# Patient Record
Sex: Male | Born: 1985 | Race: White | Hispanic: No | Marital: Single | Smoking: Current every day smoker
Health system: Southern US, Community
[De-identification: ages and names within clinical notes are randomized; demographics above are authoritative.]

## PROBLEM LIST (undated history)

## (undated) HISTORY — PX: SHOULDER SURGERY: SHX246

---

## 2011-06-23 ENCOUNTER — Encounter (HOSPITAL_COMMUNITY): Payer: Self-pay | Admitting: *Deleted

## 2011-06-23 ENCOUNTER — Emergency Department (HOSPITAL_COMMUNITY)
Admission: EM | Admit: 2011-06-23 | Discharge: 2011-06-23 | Disposition: A | Discharge status: Home / Self Care | Payer: Self-pay | Attending: Emergency Medicine | Admitting: Emergency Medicine

## 2011-06-23 DIAGNOSIS — K047 Periapical abscess without sinus: Secondary | ICD-10-CM | POA: Insufficient documentation

## 2011-06-23 DIAGNOSIS — R220 Localized swelling, mass and lump, head: Secondary | ICD-10-CM | POA: Insufficient documentation

## 2011-06-23 DIAGNOSIS — R221 Localized swelling, mass and lump, neck: Secondary | ICD-10-CM | POA: Insufficient documentation

## 2011-06-23 DIAGNOSIS — K029 Dental caries, unspecified: Secondary | ICD-10-CM | POA: Insufficient documentation

## 2011-06-23 DIAGNOSIS — H9209 Otalgia, unspecified ear: Secondary | ICD-10-CM | POA: Insufficient documentation

## 2011-06-23 DIAGNOSIS — K089 Disorder of teeth and supporting structures, unspecified: Secondary | ICD-10-CM | POA: Insufficient documentation

## 2011-06-23 MED ORDER — AMOXICILLIN 500 MG PO CAPS: 500.0000 mg | ORAL_CAPSULE | Freq: Three times a day (TID) | ORAL | Status: AC

## 2011-06-23 MED ORDER — IBUPROFEN 600 MG PO TABS: 600.0000 mg | ORAL_TABLET | Freq: Four times a day (QID) | ORAL | Status: AC | PRN

## 2011-06-23 MED ORDER — HYDROCODONE-ACETAMINOPHEN 5-325 MG PO TABS: 1.0000 | ORAL_TABLET | ORAL | Status: AC | PRN

## 2011-06-23 NOTE — ED Provider Notes (Signed)
Medical screening examination/treatment/procedure(s) were performed by non-physician practitioner and as supervising physician I was immediately available for consultation/collaboration.   Nathian Stencil, MD 06/23/11 2028 

## 2011-06-23 NOTE — ED Provider Notes (Signed)
History     CSN: [id number]  Arrival date & time 06/23/11  1816   First MD Initiated Contact with Patient 06/23/11 1838      Chief Complaint  Patient presents with  . Dental Pain    (Consider location/radiation/quality/duration/timing/severity/associated sxs/prior treatment) Patient is a 26 y.o. male presenting with tooth pain. The history is provided by the patient.  Dental PainThe primary symptoms include mouth pain. Primary symptoms do not include headaches, fever, shortness of breath or sore throat. Primary symptoms comment: Patient has a history of poor dentition, with multiple caries. This afternoon he developed rather sudden onset of pain with increasing swelling in his left upper first molar tooth. The symptoms began 2 to 6 hours ago. The symptoms are worsening. The symptoms occur constantly.  Additional symptoms include: gum swelling, gum tenderness and ear pain. Additional symptoms do not include: purulent gums and facial swelling. Medical issues include: smoking.    History reviewed. No pertinent past medical history.  Past Surgical History  Procedure Date  . Shoulder surgery     Family History  Problem Relation Age of Onset  . Diabetes Mother   . Heart failure Father     History  Substance Use Topics  . Smoking status: Current Everyday Smoker  . Smokeless tobacco: Not on file  . Alcohol Use: No      Review of Systems  Constitutional: Negative for fever.  HENT: Positive for ear pain and dental problem. Negative for congestion, sore throat, facial swelling and neck pain.   Eyes: Negative.   Respiratory: Negative for chest tightness and shortness of breath.   Cardiovascular: Negative for chest pain.  Gastrointestinal: Negative for nausea and abdominal pain.  Genitourinary: Negative.   Musculoskeletal: Negative for joint swelling and arthralgias.  Skin: Negative.  Negative for rash and wound.  Neurological: Negative for dizziness, weakness,  light-headedness, numbness and headaches.  Hematological: Negative.   Psychiatric/Behavioral: Negative.     Allergies  Sulfa antibiotics  Home Medications   Current Outpatient Rx  Name Route Sig Dispense Refill  . AMOXICILLIN 500 MG PO CAPS Oral Take 1 capsule (500 mg total) by mouth 3 (three) times daily. 30 capsule 0  . HYDROCODONE-ACETAMINOPHEN 5-325 MG PO TABS Oral Take 1 tablet by mouth every 4 (four) hours as needed for pain. 15 tablet 0  . IBUPROFEN 600 MG PO TABS Oral Take 1 tablet (600 mg total) by mouth every 6 (six) hours as needed for pain. 30 tablet 0    BP 123/74  Pulse 88  Temp(Src) 98.1 F (36.7 C) (Oral)  Resp 20  Wt 163 lb (73.936 kg)  SpO2 98%  Physical Exam  Constitutional: He is oriented to person, place, and time. He appears well-developed and well-nourished. No distress.  HENT:  Head: Normocephalic and atraumatic.  Right Ear: Tympanic membrane and external ear normal.  Left Ear: Tympanic membrane and external ear normal.  Mouth/Throat: Oropharynx is clear and moist and mucous membranes are normal. No oral lesions. Dental abscesses present.    Eyes: Conjunctivae are normal.  Neck: Normal range of motion. Neck supple.  Cardiovascular: Normal rate and normal heart sounds.   Pulmonary/Chest: Effort normal.  Abdominal: He exhibits no distension.  Musculoskeletal: Normal range of motion.  Lymphadenopathy:    He has no cervical adenopathy.  Neurological: He is alert and oriented to person, place, and time.  Skin: Skin is warm and dry. No erythema.  Psychiatric: He has a normal mood and affect.    ED  Course  Procedures (including critical care time)  Labs Reviewed - No data to display No results found.   1. Dental abscess       MDM  Amoxil, ibuprofen 600 mg, hydrocodone #15. Dental referral list given.        Candis Musa, PA 06/23/11 [phone]

## 2011-06-23 NOTE — ED Notes (Signed)
Pt states tooth started "hurting earlier in the day." Pt reports upper left molar is sore, dental cary noted.

## 2012-02-14 ENCOUNTER — Emergency Department (HOSPITAL_COMMUNITY)
Admission: EM | Admit: 2012-02-14 | Discharge: 2012-02-14 | Disposition: A | Discharge status: Home / Self Care | Payer: Self-pay | Attending: Emergency Medicine | Admitting: Emergency Medicine

## 2012-02-14 ENCOUNTER — Encounter (HOSPITAL_COMMUNITY): Payer: Self-pay | Admitting: *Deleted

## 2012-02-14 DIAGNOSIS — R070 Pain in throat: Secondary | ICD-10-CM | POA: Insufficient documentation

## 2012-02-14 DIAGNOSIS — J020 Streptococcal pharyngitis: Secondary | ICD-10-CM

## 2012-02-14 DIAGNOSIS — R509 Fever, unspecified: Secondary | ICD-10-CM | POA: Insufficient documentation

## 2012-02-14 MED ORDER — PENICILLIN G BENZATHINE 1200000 UNIT/2ML IM SUSP
1.2000 10*6.[IU] | Freq: Once | INTRAMUSCULAR | Status: AC
  Administered 2012-02-14: 1.2 10*6.[IU] via INTRAMUSCULAR
  Filled 2012-02-14: qty 2

## 2012-02-14 MED ORDER — OXYCODONE-ACETAMINOPHEN 5-325 MG PO TABS: 2.0000 | ORAL_TABLET | ORAL | Status: AC | PRN

## 2012-02-14 MED ORDER — OXYCODONE-ACETAMINOPHEN 5-325 MG PO TABS
1.0000 | ORAL_TABLET | Freq: Once | ORAL | Status: AC
  Administered 2012-02-14: 1 via ORAL
  Filled 2012-02-14: qty 1

## 2012-02-14 NOTE — ED Notes (Signed)
Sore throat, fever,chills, body aches.headache

## 2012-02-14 NOTE — ED Provider Notes (Signed)
History     CSN: [id number]  Arrival date & time 02/14/12  2030   First MD Initiated Contact with Patient 02/14/12 2111      Chief Complaint  Patient presents with  . Sore Throat    (Consider location/radiation/quality/duration/timing/severity/associated sxs/prior treatment) HPI Pt reports 24hrs of sore throat, moderate to severe and aching, worse with swallowing. Associated with fever, body aches, but no cough or vomiting.  History reviewed. No pertinent past medical history.  Past Surgical History  Procedure Date  . Shoulder surgery     Family History  Problem Relation Age of Onset  . Diabetes Mother   . Heart failure Father     History  Substance Use Topics  . Smoking status: Current Everyday Smoker  . Smokeless tobacco: Not on file  . Alcohol Use: No      Review of Systems All other systems reviewed and are negative except as noted in HPI.   Allergies  Sulfa antibiotics  Home Medications  No current outpatient prescriptions on file.  BP 121/65  Pulse 120  Temp 100.8 F (38.2 C) (Oral)  Resp 20  Ht 6\' 1"  (1.854 m)  Wt 165 lb (74.844 kg)  BMI 21.77 kg/m2  SpO2 100%  Physical Exam  Nursing note and vitals reviewed. Constitutional: He is oriented to person, place, and time. He appears well-developed and well-nourished.  HENT:  Head: Normocephalic and atraumatic.  Mouth/Throat: No oropharyngeal exudate.       Erythematous and indurated posterior pharynx, no tonsillar exudate  Eyes: EOM are normal. Pupils are equal, round, and reactive to light.  Neck: Normal range of motion. Neck supple.  Cardiovascular: Normal rate, normal heart sounds and intact distal pulses.   Pulmonary/Chest: Effort normal and breath sounds normal.  Abdominal: Bowel sounds are normal. He exhibits no distension. There is no tenderness.  Musculoskeletal: Normal range of motion. He exhibits no edema and no tenderness.  Lymphadenopathy:    He has cervical adenopathy.    Neurological: He is alert and oriented to person, place, and time. He has normal strength. No cranial nerve deficit or sensory deficit.  Skin: Skin is warm and dry. No rash noted.  Psychiatric: He has a normal mood and affect.    ED Course  Procedures (including critical care time)  Labs Reviewed - No data to display No results found.   No diagnosis found.    MDM  Pt with 3/4 Centor criteria, will treat empirically for Strep.       Charles B. Bernette Mayers, MD 02/14/12 2118

## 2012-02-15 ENCOUNTER — Encounter (HOSPITAL_COMMUNITY): Payer: Self-pay | Admitting: *Deleted

## 2012-02-15 ENCOUNTER — Emergency Department (HOSPITAL_COMMUNITY)
Admission: EM | Admit: 2012-02-15 | Discharge: 2012-02-15 | Disposition: A | Discharge status: Home / Self Care | Payer: Self-pay | Attending: Emergency Medicine | Admitting: Emergency Medicine

## 2012-02-15 DIAGNOSIS — J029 Acute pharyngitis, unspecified: Secondary | ICD-10-CM | POA: Insufficient documentation

## 2012-02-15 DIAGNOSIS — F17200 Nicotine dependence, unspecified, uncomplicated: Secondary | ICD-10-CM | POA: Insufficient documentation

## 2012-02-15 DIAGNOSIS — Z882 Allergy status to sulfonamides status: Secondary | ICD-10-CM | POA: Insufficient documentation

## 2012-02-15 NOTE — ED Notes (Signed)
Pt reports continued sore throat, and generalized aches.  Was seen yesterday for same, states not improving.

## 2012-02-22 NOTE — ED Provider Notes (Signed)
History    Male with a sore throat. Onset about 3 days ago. Patient was just evaluated in emergency room for the same complaint 2 days ago. She is treated periodically for strep with Bicillin injection. This returned because he continued symptoms. She says her pain is no better. No difficulty breathing or swallowing. Mild nausea but no vomiting. No coughStridor or wheezing. Does not feel short of breath. CSN: [id number]  Arrival date & time 02/15/12  1908   First MD Initiated Contact with Patient 02/15/12 1951      Chief Complaint  Patient presents with  . Sore Throat    (Consider location/radiation/quality/duration/timing/severity/associated sxs/prior treatment) HPI  History reviewed. No pertinent past medical history.  Past Surgical History  Procedure Date  . Shoulder surgery     Family History  Problem Relation Age of Onset  . Diabetes Mother   . Heart failure Father     History  Substance Use Topics  . Smoking status: Current Every Day Smoker  . Smokeless tobacco: Not on file  . Alcohol Use: No      Review of Systems   Review of symptoms negative unless otherwise noted in HPI.   Allergies  Sulfa antibiotics  Home Medications   Current Outpatient Rx  Name Route Sig Dispense Refill  . CHLORPHEN-PHENYLEPH-ASA 2-7.8-325 MG PO TBEF Oral Take 2 capsules by mouth daily as needed.    . OXYCODONE-ACETAMINOPHEN 5-325 MG PO TABS Oral Take 2 tablets by mouth every 4 (four) hours as needed for pain. 15 tablet 0    BP 118/80  Pulse 91  Temp 97.7 F (36.5 C) (Oral)  Resp 20  Ht 6\' 1"  (1.854 m)  Wt 165 lb (74.844 kg)  BMI 21.77 kg/m2  SpO2 99%  Physical Exam  Nursing note and vitals reviewed. Constitutional: He appears well-developed and well-nourished. No distress.  HENT:  Head: Normocephalic and atraumatic.       Pharyngitis with exudate. Uvula is midline. Handling secretions. Normal phonation. Submental tissues are soft. Neck supple. Tender cervical  adenopathy.  Eyes: Conjunctivae normal are normal. Right eye exhibits no discharge. Left eye exhibits no discharge.  Neck: Neck supple.  Cardiovascular: Normal rate, regular rhythm and normal heart sounds.  Exam reveals no gallop and no friction rub.   No murmur heard. Pulmonary/Chest: Effort normal and breath sounds normal. No respiratory distress.  Abdominal: Soft. He exhibits no distension. There is no tenderness.  Musculoskeletal: He exhibits no edema and no tenderness.  Neurological: He is alert.  Skin: Skin is warm and dry.  Psychiatric: He has a normal mood and affect. His behavior is normal. Thought content normal.    ED Course  Procedures (including critical care time)  Labs Reviewed - No data to display No results found.   1. Pharyngitis       MDM  Male with sore throat. Patient was just evaluated for the same. Empirically with strep with Bicillin injection. She is returning for continued symptoms. On exam there is no evidence of deep space neck infection. Plan continued symptomatic treatment. Outpatient followup as needed otherwise.        Raeford Razor, MD 02/22/12 [phone]

## 2012-08-07 ENCOUNTER — Encounter (HOSPITAL_COMMUNITY): Payer: Self-pay | Admitting: *Deleted

## 2012-08-07 ENCOUNTER — Emergency Department (HOSPITAL_COMMUNITY): Payer: Self-pay

## 2012-08-07 ENCOUNTER — Emergency Department (HOSPITAL_COMMUNITY)
Admission: EM | Admit: 2012-08-07 | Discharge: 2012-08-07 | Disposition: A | Discharge status: Home / Self Care | Payer: Self-pay | Attending: Emergency Medicine | Admitting: Emergency Medicine

## 2012-08-07 DIAGNOSIS — W208XXA Other cause of strike by thrown, projected or falling object, initial encounter: Secondary | ICD-10-CM | POA: Insufficient documentation

## 2012-08-07 DIAGNOSIS — Y9389 Activity, other specified: Secondary | ICD-10-CM | POA: Insufficient documentation

## 2012-08-07 DIAGNOSIS — F17200 Nicotine dependence, unspecified, uncomplicated: Secondary | ICD-10-CM | POA: Insufficient documentation

## 2012-08-07 DIAGNOSIS — S9032XA Contusion of left foot, initial encounter: Secondary | ICD-10-CM

## 2012-08-07 DIAGNOSIS — S90129A Contusion of unspecified lesser toe(s) without damage to nail, initial encounter: Secondary | ICD-10-CM | POA: Insufficient documentation

## 2012-08-07 DIAGNOSIS — Y929 Unspecified place or not applicable: Secondary | ICD-10-CM | POA: Insufficient documentation

## 2012-08-07 MED ORDER — IBUPROFEN 800 MG PO TABS: 800.0000 mg | ORAL_TABLET | Freq: Three times a day (TID) | ORAL | Status: DC

## 2012-08-07 MED ORDER — HYDROCODONE-ACETAMINOPHEN 5-325 MG PO TABS: 1.0000 | ORAL_TABLET | ORAL | Status: DC | PRN

## 2012-08-07 NOTE — ED Provider Notes (Signed)
History     CSN: [id number]  Arrival date & time 08/07/12  1443   First MD Initiated Contact with Patient 08/07/12 1613      Chief Complaint  Patient presents with  . Toe Pain    (Consider location/radiation/quality/duration/timing/severity/associated sxs/prior treatment) Patient is a 27 y.o. male presenting with toe pain. The history is provided by the patient.  Toe Pain This is a new problem. The current episode started yesterday. The problem occurs constantly. The problem has been unchanged. Pertinent negatives include no abdominal pain, arthralgias, chest pain, coughing, neck pain or numbness. The symptoms are aggravated by standing and walking. He has tried nothing for the symptoms. The treatment provided no relief.    History reviewed. No pertinent past medical history.  Past Surgical History  Procedure Laterality Date  . Shoulder surgery      Family History  Problem Relation Age of Onset  . Diabetes Mother   . Heart failure Father     History  Substance Use Topics  . Smoking status: Current Every Day Smoker  . Smokeless tobacco: Not on file  . Alcohol Use: No      Review of Systems  Constitutional: Negative for activity change.       All ROS Neg except as noted in HPI  HENT: Negative for nosebleeds and neck pain.   Eyes: Negative for photophobia and discharge.  Respiratory: Negative for cough, shortness of breath and wheezing.   Cardiovascular: Negative for chest pain and palpitations.  Gastrointestinal: Negative for abdominal pain and blood in stool.  Genitourinary: Negative for dysuria, frequency and hematuria.  Musculoskeletal: Negative for back pain and arthralgias.  Skin: Negative.   Neurological: Negative for dizziness, seizures, speech difficulty and numbness.  Psychiatric/Behavioral: Negative for hallucinations and confusion.    Allergies  Sulfa antibiotics  Home Medications   Current Outpatient Rx  Name  Route  Sig  Dispense  Refill  .  ibuprofen (ADVIL,MOTRIN) 200 MG tablet   Oral   Take 800 mg by mouth once as needed for pain.           BP 134/79  Pulse 72  Temp(Src) 97.9 F (36.6 C) (Oral)  Resp 18  SpO2 97%  Physical Exam  Nursing note and vitals reviewed. Constitutional: He is oriented to person, place, and time. He appears well-developed and well-nourished.  Non-toxic appearance.  HENT:  Head: Normocephalic.  Right Ear: Tympanic membrane and external ear normal.  Left Ear: Tympanic membrane and external ear normal.  Eyes: EOM and lids are normal. Pupils are equal, round, and reactive to light.  Neck: Normal range of motion. Neck supple. Carotid bruit is not present.  Cardiovascular: Normal rate, regular rhythm, normal heart sounds, intact distal pulses and normal pulses.   Pulmonary/Chest: Breath sounds normal. No respiratory distress.  Abdominal: Soft. Bowel sounds are normal. There is no tenderness. There is no guarding.  Musculoskeletal: Normal range of motion.  There is swelling and bruise to the second and third toe of the left foot. There is a bruise to the lateral mid left foot. There is no broken skin areas. There no lesions between the toes. There is no lesion of the plantar surface of the left foot. There is good range of motion of the toes. The Achilles tendon is intact. The dorsalis pedis pulses 2+.  Lymphadenopathy:       Head (right side): No submandibular adenopathy present.       Head (left side): No submandibular adenopathy present.  He has no cervical adenopathy.  Neurological: He is alert and oriented to person, place, and time. He has normal strength. No cranial nerve deficit or sensory deficit.  Skin: Skin is warm and dry.  Psychiatric: He has a normal mood and affect. His speech is normal.    ED Course  Procedures (including critical care time)  Labs Reviewed - No data to display Dg Foot Complete Left  08/07/2012  *RADIOLOGY REPORT*  Clinical Data: Foot injury with pain  laterally.  LEFT FOOT - COMPLETE 3+ VIEW  Comparison: None.  Findings: There is no evidence for an acute fracture.  Bony alignment is anatomic. No worrisome lytic or sclerotic osseous lesion.  IMPRESSION: No acute bony findings.   Original Report Authenticated By: Kennith Center, M.D.      No diagnosis found.    MDM  I have reviewed nursing notes, vital signs, and all appropriate lab and imaging results for this patient. Patient dropped heavy object on the left foot on yesterday March 2. He complains of pain swelling and bruising of the toes of the left foot in the mid left foot. The patient x-ray is negative for fracture or dislocation. The plan at this time is for the patient to elevate the left foot. He is given a prescription for ibuprofen 800 mg 13 times daily with food, also Norco every 4 hours as needed for pain #15 tablets.   Kathie Dike, [place] 08/07/12 1723

## 2012-08-07 NOTE — ED Notes (Signed)
Pt was moving a window unit air conditioner yesterday and dropped it on his left foot, pt c/o pain to left second and third toe and edge of foot where foot extends into toe area. Cms intact, pain, swelling and bruising noted to toe and foot area.

## 2012-08-08 NOTE — ED Provider Notes (Signed)
Medical screening examination/treatment/procedure(s) were performed by non-physician practitioner and as supervising physician I was immediately available for consultation/collaboration.    Shelda Jakes, MD 08/08/12 [phone]

## 2013-01-12 ENCOUNTER — Emergency Department (HOSPITAL_COMMUNITY)
Admission: EM | Admit: 2013-01-12 | Discharge: 2013-01-12 | Disposition: A | Discharge status: Home / Self Care | Payer: Self-pay | Attending: Emergency Medicine | Admitting: Emergency Medicine

## 2013-01-12 ENCOUNTER — Emergency Department (HOSPITAL_COMMUNITY): Payer: Self-pay

## 2013-01-12 ENCOUNTER — Encounter (HOSPITAL_COMMUNITY): Payer: Self-pay | Admitting: *Deleted

## 2013-01-12 DIAGNOSIS — S39012A Strain of muscle, fascia and tendon of lower back, initial encounter: Secondary | ICD-10-CM

## 2013-01-12 DIAGNOSIS — S335XXA Sprain of ligaments of lumbar spine, initial encounter: Secondary | ICD-10-CM | POA: Insufficient documentation

## 2013-01-12 DIAGNOSIS — F17200 Nicotine dependence, unspecified, uncomplicated: Secondary | ICD-10-CM | POA: Insufficient documentation

## 2013-01-12 DIAGNOSIS — X500XXA Overexertion from strenuous movement or load, initial encounter: Secondary | ICD-10-CM | POA: Insufficient documentation

## 2013-01-12 DIAGNOSIS — Y9289 Other specified places as the place of occurrence of the external cause: Secondary | ICD-10-CM | POA: Insufficient documentation

## 2013-01-12 DIAGNOSIS — Y93H2 Activity, gardening and landscaping: Secondary | ICD-10-CM | POA: Insufficient documentation

## 2013-01-12 MED ORDER — CYCLOBENZAPRINE HCL 10 MG PO TABS
10.0000 mg | ORAL_TABLET | Freq: Once | ORAL | Status: AC
  Administered 2013-01-12: 10 mg via ORAL
  Filled 2013-01-12: qty 1

## 2013-01-12 MED ORDER — HYDROCODONE-ACETAMINOPHEN 5-325 MG PO TABS
1.0000 | ORAL_TABLET | Freq: Once | ORAL | Status: AC
  Administered 2013-01-12: 1 via ORAL
  Filled 2013-01-12: qty 1

## 2013-01-12 MED ORDER — PREDNISONE 50 MG PO TABS
60.0000 mg | ORAL_TABLET | Freq: Once | ORAL | Status: AC
  Administered 2013-01-12: 60 mg via ORAL
  Filled 2013-01-12: qty 1

## 2013-01-12 MED ORDER — HYDROCODONE-ACETAMINOPHEN 5-325 MG PO TABS: 1.0000 | ORAL_TABLET | ORAL | Status: DC | PRN

## 2013-01-12 MED ORDER — CYCLOBENZAPRINE HCL 5 MG PO TABS: 5.0000 mg | ORAL_TABLET | Freq: Three times a day (TID) | ORAL | Status: DC | PRN

## 2013-01-12 MED ORDER — PREDNISONE 10 MG PO TABS: ORAL_TABLET | ORAL | Status: DC

## 2013-01-12 NOTE — ED Notes (Signed)
Low back pain after picking up a heavy rock.

## 2013-01-14 NOTE — ED Provider Notes (Signed)
CSN: [id number]     Arrival date & time 01/12/13  1238 History     First MD Initiated Contact with Patient 01/12/13 1454     Chief Complaint  Patient presents with  . Back Pain   (Consider location/radiation/quality/duration/timing/severity/associated sxs/prior Treatment) HPI Comments: Jake Luna is a 27 y.o. Male presenting with acute on intermittently chronic low back pain which has which has been present for the past day and triggered by attempting to pick up a heavy rock during a landscaping project.  There is no radiation into or weakness of his lower extremities and no urinary or bowel retention or incontinence.  Patient does not have a history of cancer or IVDU.  He has taken ibuprofen without relief of his pain which is nonradiating and worsened with certain movements and positions,  Especially trying to stand upright from a seated position.      The history is provided by the patient.    History reviewed. No pertinent past medical history. Past Surgical History  Procedure Laterality Date  . Shoulder surgery     Family History  Problem Relation Age of Onset  . Diabetes Mother   . Heart failure Father    History  Substance Use Topics  . Smoking status: Current Every Day Smoker  . Smokeless tobacco: Not on file  . Alcohol Use: No    Review of Systems  Constitutional: Negative for fever.  Respiratory: Negative for shortness of breath.   Cardiovascular: Negative for chest pain and leg swelling.  Gastrointestinal: Negative for abdominal pain, constipation and abdominal distention.  Genitourinary: Negative for dysuria, urgency, frequency, flank pain and difficulty urinating.  Musculoskeletal: Positive for back pain. Negative for joint swelling and gait problem.  Skin: Negative for rash.  Neurological: Negative for weakness and numbness.    Allergies  Sulfa antibiotics  Home Medications   Current Outpatient Rx  Name  Route  Sig  Dispense  Refill  . ibuprofen  (ADVIL,MOTRIN) 200 MG tablet   Oral   Take 400 mg by mouth every 6 (six) hours as needed for headache.          . cyclobenzaprine (FLEXERIL) 5 MG tablet   Oral   Take 1 tablet (5 mg total) by mouth 3 (three) times daily as needed for muscle spasms.   15 tablet   0   . HYDROcodone-acetaminophen (NORCO/VICODIN) 5-325 MG per tablet   Oral   Take 1 tablet by mouth every 4 (four) hours as needed for pain.   20 tablet   0   . predniSONE (DELTASONE) 10 MG tablet      6, 5, 4, 3, 2 then 1 tablet by mouth daily for 6 days total.   21 tablet   0    BP 118/72  Pulse 88  Temp(Src) 97.7 F (36.5 C) (Oral)  Resp 20  Ht 6\' 1"  (1.854 m)  Wt 170 lb (77.111 kg)  BMI 22.43 kg/m2  SpO2 100% Physical Exam  Nursing note and vitals reviewed. Constitutional: He appears well-developed and well-nourished.  HENT:  Head: Normocephalic.  Eyes: Conjunctivae are normal.  Neck: Normal range of motion. Neck supple.  Cardiovascular: Normal rate and intact distal pulses.   Pedal pulses normal.  Pulmonary/Chest: Effort normal.  Abdominal: Soft. Bowel sounds are normal. He exhibits no distension and no mass.  Musculoskeletal: Normal range of motion. He exhibits no edema.       Right shoulder: He exhibits spasm. He exhibits no swelling and no deformity.  Lumbar back: He exhibits tenderness. He exhibits no swelling, no edema and no spasm.  Mid lumbar and paralumbar ttp.  Neurological: He is alert. He has normal strength. He displays no atrophy and no tremor. No sensory deficit. Gait normal.  Reflex Scores:      Patellar reflexes are 2+ on the right side and 2+ on the left side.      Achilles reflexes are 2+ on the right side and 2+ on the left side. No strength deficit noted in hip and knee flexor and extensor muscle groups.  Ankle flexion and extension intact.  Skin: Skin is warm and dry.  Psychiatric: He has a normal mood and affect.    ED Course   Procedures (including critical care  time)  Labs Reviewed - No data to display No results found. 1. Lumbar strain, initial encounter     MDM  Patients labs and/or radiological studies were viewed and considered during the medical decision making and disposition process. No neuro deficit on exam or by history to suggest emergent or surgical presentation.  Also discussed worsened sx that should prompt immediate re-evaluation including distal weakness, bowel/bladder retention/incontinence.  He was prescribed flexeril, hydrocodone and prednisone,  Encouraged rest,  Ice tx,  Avoid activity that worsens pain,  Recheck if not improving over the next week.        Burgess Amor, PA-C 01/14/13 2225

## 2013-01-19 NOTE — ED Provider Notes (Signed)
History/physical exam/procedure(s) were performed by non-physician practitioner and as supervising physician I was immediately available for consultation/collaboration. I have reviewed all notes and am in agreement with care and plan.   Harsimran Westman S Laurine Kuyper, MD 01/19/13 1243 

## 2013-02-16 ENCOUNTER — Emergency Department (HOSPITAL_COMMUNITY)
Admission: EM | Admit: 2013-02-16 | Discharge: 2013-02-16 | Disposition: A | Discharge status: Home / Self Care | Payer: 59 | Attending: Emergency Medicine | Admitting: Emergency Medicine

## 2013-02-16 ENCOUNTER — Encounter (HOSPITAL_COMMUNITY): Payer: Self-pay

## 2013-02-16 DIAGNOSIS — M545 Low back pain, unspecified: Secondary | ICD-10-CM | POA: Insufficient documentation

## 2013-02-16 DIAGNOSIS — F17200 Nicotine dependence, unspecified, uncomplicated: Secondary | ICD-10-CM | POA: Insufficient documentation

## 2013-02-16 DIAGNOSIS — G8929 Other chronic pain: Secondary | ICD-10-CM | POA: Insufficient documentation

## 2013-02-16 DIAGNOSIS — M5430 Sciatica, unspecified side: Secondary | ICD-10-CM | POA: Insufficient documentation

## 2013-02-16 DIAGNOSIS — M5431 Sciatica, right side: Secondary | ICD-10-CM

## 2013-02-16 MED ORDER — CYCLOBENZAPRINE HCL 10 MG PO TABS
10.0000 mg | ORAL_TABLET | Freq: Once | ORAL | Status: AC
  Administered 2013-02-16: 10 mg via ORAL
  Filled 2013-02-16: qty 1

## 2013-02-16 MED ORDER — MELOXICAM 7.5 MG PO TABS: 7.5000 mg | ORAL_TABLET | Freq: Every day | ORAL | Status: DC

## 2013-02-16 MED ORDER — CYCLOBENZAPRINE HCL 10 MG PO TABS: 10.0000 mg | ORAL_TABLET | Freq: Two times a day (BID) | ORAL | Status: DC | PRN

## 2013-02-16 MED ORDER — HYDROCODONE-ACETAMINOPHEN 5-325 MG PO TABS
1.0000 | ORAL_TABLET | Freq: Once | ORAL | Status: AC
  Administered 2013-02-16: 1 via ORAL
  Filled 2013-02-16: qty 1

## 2013-02-16 NOTE — ED Provider Notes (Signed)
CSN: [id number]     Arrival date & time 02/16/13  1051 History   First MD Initiated Contact with Patient 02/16/13 1058     Chief Complaint  Patient presents with  . Back Pain   (Consider location/radiation/quality/duration/timing/severity/associated sxs/prior Treatment) Patient is a 27 y.o. male presenting with back pain. The history is provided by the patient.  Back Pain Location:  Lumbar spine Quality:  Shooting Radiates to:  R posterior upper leg Pain severity:  Severe Pain is:  Same all the time Onset quality:  Gradual Duration:  24 hours Timing:  Constant Chronicity:  Chronic Relieved by:  Nothing Worsened by:  Movement, standing and twisting Associated symptoms: no abdominal pain, no chest pain, no dysuria, no fever and no headaches    Jake Luna is a 27 y.o. male who presents to the ED with pain in his lower back after picking up a pile of wood last night. He has a history of chronic back pain. He does not have a PCP at this time.   History reviewed. No pertinent past medical history. Past Surgical History  Procedure Laterality Date  . Shoulder surgery     Family History  Problem Relation Age of Onset  . Diabetes Mother   . Heart failure Father    History  Substance Use Topics  . Smoking status: Current Every Day Smoker  . Smokeless tobacco: Not on file  . Alcohol Use: No    Review of Systems  Constitutional: Negative for fever and chills.  HENT: Negative for neck pain.   Respiratory: Negative for shortness of breath.   Cardiovascular: Negative for chest pain.  Gastrointestinal: Negative for nausea, vomiting and abdominal pain.  Genitourinary: Negative for dysuria, urgency and frequency.  Musculoskeletal: Positive for back pain.  Skin: Negative for wound.  Neurological: Negative for headaches.  Psychiatric/Behavioral: The patient is not nervous/anxious.     Allergies  Sulfa antibiotics  Home Medications   Current Outpatient Rx  Name  Route  Sig   Dispense  Refill  . cyclobenzaprine (FLEXERIL) 5 MG tablet   Oral   Take 1 tablet (5 mg total) by mouth 3 (three) times daily as needed for muscle spasms.   15 tablet   0   . HYDROcodone-acetaminophen (NORCO/VICODIN) 5-325 MG per tablet   Oral   Take 1 tablet by mouth every 4 (four) hours as needed for pain.   20 tablet   0   . ibuprofen (ADVIL,MOTRIN) 200 MG tablet   Oral   Take 400 mg by mouth every 6 (six) hours as needed for headache.          . predniSONE (DELTASONE) 10 MG tablet      6, 5, 4, 3, 2 then 1 tablet by mouth daily for 6 days total.   21 tablet   0    BP 130/70  Pulse 76  Temp(Src) 97.5 F (36.4 C) (Oral)  Resp 18  Ht 6\' 1"  (1.854 m)  Wt 170 lb (77.111 kg)  BMI 22.43 kg/m2  SpO2 100% Physical Exam  Nursing note and vitals reviewed. Constitutional: He is oriented to person, place, and time. He appears well-developed and well-nourished. No distress.  HENT:  Head: Normocephalic and atraumatic.  Eyes: EOM are normal.  Neck: Neck supple.  Cardiovascular: Normal rate, regular rhythm and normal heart sounds.   Pulmonary/Chest: Effort normal and breath sounds normal.  Abdominal: Soft. Bowel sounds are normal. There is no tenderness.  Musculoskeletal:  Lumbar back: He exhibits decreased range of motion, tenderness and spasm.       Back:  Pain with palpation and range of motion of lower lumbar area and over right sciatic nerve. Pedal pulses equal, adequate circulation, good touch sensation. Straight leg raises without difficulty. Pain with rasing the right leg.   Neurological: He is alert and oriented to person, place, and time. He has normal strength and normal reflexes. No cranial nerve deficit or sensory deficit. Gait normal.  Skin: Skin is warm and dry.  Psychiatric: He has a normal mood and affect. His behavior is normal.    ED Course  Procedures  MDM  27 y.o. male with low back pain and pain over sciatic nerve s/p straining his back  yesterday. Will treat with muscle relaxant and NSAIDS. Patient to follow up with ortho.  Discussed with the patient and all questioned fully answered.    Medication List    STOP taking these medications       HYDROcodone-acetaminophen 5-325 MG per tablet  Commonly known as:  NORCO/VICODIN     ibuprofen 200 MG tablet  Commonly known as:  ADVIL,MOTRIN     predniSONE 10 MG tablet  Commonly known as:  DELTASONE      TAKE these medications       cyclobenzaprine 10 MG tablet  Commonly known as:  FLEXERIL  Take 1 tablet (10 mg total) by mouth 2 (two) times daily as needed for muscle spasms.     meloxicam 7.5 MG tablet  Commonly known as:  MOBIC  Take 1 tablet (7.5 mg total) by mouth daily.            Jake Luna, [place] 02/16/13 1128

## 2013-02-16 NOTE — ED Notes (Signed)
Pt c/o right side lower back pain since last night. Pt states he was helping move large pieces of wood when pain began. Pt states "I lifted a piece of wood and heard a pop in my lower back". Pain has progressively worsened. Pt also reports tingling in right arm and leg which began after injury.

## 2013-02-16 NOTE — ED Notes (Signed)
Pt c/o pain in lower back radiating down r leg after unloading wood last night.

## 2013-02-16 NOTE — ED Provider Notes (Signed)
Medical screening examination/treatment/procedure(s) were performed by non-physician practitioner and as supervising physician I was immediately available for consultation/collaboration.   Jorgeluis Gurganus L Rayli Wiederhold, MD 02/16/13 1443 

## 2013-04-05 ENCOUNTER — Encounter (HOSPITAL_COMMUNITY): Payer: Self-pay | Admitting: Emergency Medicine

## 2013-04-05 ENCOUNTER — Emergency Department (HOSPITAL_COMMUNITY)
Admission: EM | Admit: 2013-04-05 | Discharge: 2013-04-06 | Disposition: A | Discharge status: Home / Self Care | Payer: PRIVATE HEALTH INSURANCE | Attending: Emergency Medicine | Admitting: Emergency Medicine

## 2013-04-05 DIAGNOSIS — S43005A Unspecified dislocation of left shoulder joint, initial encounter: Secondary | ICD-10-CM

## 2013-04-05 DIAGNOSIS — X500XXA Overexertion from strenuous movement or load, initial encounter: Secondary | ICD-10-CM | POA: Insufficient documentation

## 2013-04-05 DIAGNOSIS — R209 Unspecified disturbances of skin sensation: Secondary | ICD-10-CM | POA: Insufficient documentation

## 2013-04-05 DIAGNOSIS — Z87828 Personal history of other (healed) physical injury and trauma: Secondary | ICD-10-CM | POA: Insufficient documentation

## 2013-04-05 DIAGNOSIS — Z9889 Other specified postprocedural states: Secondary | ICD-10-CM | POA: Insufficient documentation

## 2013-04-05 DIAGNOSIS — Y9389 Activity, other specified: Secondary | ICD-10-CM | POA: Insufficient documentation

## 2013-04-05 DIAGNOSIS — Y929 Unspecified place or not applicable: Secondary | ICD-10-CM | POA: Insufficient documentation

## 2013-04-05 DIAGNOSIS — S43006A Unspecified dislocation of unspecified shoulder joint, initial encounter: Secondary | ICD-10-CM | POA: Insufficient documentation

## 2013-04-05 DIAGNOSIS — F17200 Nicotine dependence, unspecified, uncomplicated: Secondary | ICD-10-CM | POA: Insufficient documentation

## 2013-04-05 NOTE — ED Notes (Signed)
Pt reporting pain in left shoulder.  Describes pain as burning and numbness.  Pain began this evening following reported dislocation.

## 2013-04-05 NOTE — ED Provider Notes (Signed)
CSN: [id number]     Arrival date & time 04/05/13  2235 History   First MD Initiated Contact with Patient 04/05/13 2254     Chief Complaint  Patient presents with  . Shoulder Pain   (Consider location/radiation/quality/duration/timing/severity/associated sxs/prior Treatment) HPI Comments: Pt has recurrent left shoulder dislocations. He reached over his head to dry after a shower and the left shoulder came out again. He has had pain since that time. 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  Patient is a 27 y.o. male presenting with shoulder pain. The history is provided by the patient.  Shoulder Pain This is a new problem. The problem occurs constantly. The problem has been gradually worsening. Associated symptoms include arthralgias and numbness. Pertinent negatives include no abdominal pain, chest pain, coughing or neck pain. Associated symptoms comments: Tingling pain. Exacerbated by: movement. He has tried nothing for the symptoms. The treatment provided no relief.    History reviewed. No pertinent past medical history. Past Surgical History  Procedure Laterality Date  . Shoulder surgery     Family History  Problem Relation Age of Onset  . Diabetes Mother   . Heart failure Father    History  Substance Use Topics  . Smoking status: Current Every Day Smoker  . Smokeless tobacco: Not on file  . Alcohol Use: No    Review of Systems  Constitutional: Negative for activity change.       All ROS Neg except as noted in HPI  HENT: Negative for nosebleeds.   Eyes: Negative for photophobia and discharge.  Respiratory: Negative for cough, shortness of breath and wheezing.   Cardiovascular: Negative for chest pain and palpitations.  Gastrointestinal: Negative for abdominal pain and blood in stool.  Genitourinary: Negative for dysuria, frequency and hematuria.  Musculoskeletal: Positive for arthralgias. Negative for  back pain and neck pain.  Skin: Negative.   Neurological: Positive for numbness. Negative for dizziness, seizures and speech difficulty.  Psychiatric/Behavioral: Negative for hallucinations and confusion.    Allergies  Sulfa antibiotics  Home Medications  No current outpatient prescriptions on file. BP 142/79  Pulse 76  Temp(Src) 98 F (36.7 C) (Oral)  Resp 16  Ht 6' (1.829 m)  Wt 165 lb (74.844 kg)  BMI 22.37 kg/m2  SpO2 100% Physical Exam  Nursing note and vitals reviewed. Constitutional: He is oriented to person, place, and time. He appears well-developed and well-nourished.  Non-toxic appearance.  HENT:  Head: Normocephalic.  Right Ear: Tympanic membrane and external ear normal.  Left Ear: Tympanic membrane and external ear normal.  Eyes: EOM and lids are normal. Pupils are equal, round, and reactive to light.  Neck: Normal range of motion. Neck supple. Carotid bruit is not present.  Cardiovascular: Normal rate, regular rhythm, normal heart sounds, intact distal pulses and normal pulses.   Pulmonary/Chest: Breath sounds normal. No respiratory distress.  Abdominal: Soft. Bowel sounds are normal. There is no tenderness. There is no guarding.  Musculoskeletal: Normal range of motion.  No dislocation of the left shoulder at this time. Scapula in place. Pain to anterior and posterior palpation. No effusion. Good ROM of the left elbow and wrist. PUlses 2+.  Lymphadenopathy:       Head (right side): No submandibular adenopathy present.       Head (left side): No submandibular adenopathy present.    He has no cervical adenopathy.  Neurological: He is alert and  oriented to person, place, and time. He has normal strength. No cranial nerve deficit or sensory deficit.  Grip is symmetrical  Skin: Skin is warm and dry.  Psychiatric: He has a normal mood and affect. His speech is normal.    ED Course  Procedures (including critical care time) Labs Review Labs Reviewed - No data to  display Imaging Review No results found.  EKG Interpretation   None       MDM  No diagnosis found. **I have reviewed nursing notes, vital signs, and all appropriate lab and imaging results for this patient.*  Pt has chronic left shoulder dislocations. This one resolved on its own. Pt fitted with a shoulder immobilizer. Rx for diclofenac and norco given to the patient. Pt advised to see Dr Romeo Apple for evaluation as soon as possible.  Kathie Dike, PA-C 04/06/13 [phone]

## 2013-04-06 MED ORDER — DICLOFENAC SODIUM 75 MG PO TBEC: 75.0000 mg | DELAYED_RELEASE_TABLET | Freq: Two times a day (BID) | ORAL | Status: DC

## 2013-04-06 MED ORDER — ACETAMINOPHEN-CODEINE #3 300-30 MG PO TABS
2.0000 | ORAL_TABLET | Freq: Once | ORAL | Status: AC
  Administered 2013-04-06: 2 via ORAL
  Filled 2013-04-06: qty 2

## 2013-04-06 MED ORDER — KETOROLAC TROMETHAMINE 10 MG PO TABS
10.0000 mg | ORAL_TABLET | Freq: Once | ORAL | Status: AC
  Administered 2013-04-06: 10 mg via ORAL
  Filled 2013-04-06: qty 1

## 2013-04-06 MED ORDER — HYDROCODONE-ACETAMINOPHEN 5-325 MG PO TABS: 1.0000 | ORAL_TABLET | ORAL | Status: DC | PRN

## 2013-04-06 NOTE — ED Provider Notes (Signed)
Medical screening examination/treatment/procedure(s) were performed by non-physician practitioner and as supervising physician I was immediately available for consultation/collaboration.   Adir Schicker, MD 04/06/13 0320 

## 2013-10-04 ENCOUNTER — Encounter (HOSPITAL_COMMUNITY): Payer: Self-pay | Admitting: Emergency Medicine

## 2013-10-04 ENCOUNTER — Emergency Department (HOSPITAL_COMMUNITY)
Admission: EM | Admit: 2013-10-04 | Discharge: 2013-10-04 | Discharge status: Against Medical Advice | Payer: PRIVATE HEALTH INSURANCE | Attending: Emergency Medicine | Admitting: Emergency Medicine

## 2013-10-04 DIAGNOSIS — M549 Dorsalgia, unspecified: Secondary | ICD-10-CM | POA: Insufficient documentation

## 2013-10-04 DIAGNOSIS — F17200 Nicotine dependence, unspecified, uncomplicated: Secondary | ICD-10-CM | POA: Insufficient documentation

## 2013-10-04 NOTE — ED Notes (Signed)
Back pain radiating down rt side after picking up a concrete bird bath

## 2013-10-04 NOTE — ED Notes (Signed)
Called x 1 for room placement

## 2013-10-04 NOTE — ED Notes (Signed)
Called for room 2nd time. No answer.

## 2014-02-19 ENCOUNTER — Encounter (HOSPITAL_COMMUNITY): Payer: Self-pay | Admitting: Emergency Medicine

## 2014-02-19 ENCOUNTER — Emergency Department (HOSPITAL_COMMUNITY)
Admission: EM | Admit: 2014-02-19 | Discharge: 2014-02-19 | Disposition: A | Discharge status: Home / Self Care | Payer: PRIVATE HEALTH INSURANCE | Attending: Emergency Medicine | Admitting: Emergency Medicine

## 2014-02-19 ENCOUNTER — Emergency Department (HOSPITAL_COMMUNITY): Payer: PRIVATE HEALTH INSURANCE

## 2014-02-19 DIAGNOSIS — R61 Generalized hyperhidrosis: Secondary | ICD-10-CM | POA: Diagnosis not present

## 2014-02-19 DIAGNOSIS — F17200 Nicotine dependence, unspecified, uncomplicated: Secondary | ICD-10-CM | POA: Diagnosis not present

## 2014-02-19 DIAGNOSIS — R51 Headache: Secondary | ICD-10-CM | POA: Diagnosis not present

## 2014-02-19 DIAGNOSIS — R079 Chest pain, unspecified: Secondary | ICD-10-CM | POA: Insufficient documentation

## 2014-02-19 DIAGNOSIS — R072 Precordial pain: Secondary | ICD-10-CM | POA: Diagnosis not present

## 2014-02-19 DIAGNOSIS — R21 Rash and other nonspecific skin eruption: Secondary | ICD-10-CM | POA: Diagnosis not present

## 2014-02-19 DIAGNOSIS — R55 Syncope and collapse: Secondary | ICD-10-CM | POA: Diagnosis not present

## 2014-02-19 DIAGNOSIS — R110 Nausea: Secondary | ICD-10-CM | POA: Insufficient documentation

## 2014-02-19 DIAGNOSIS — Z7982 Long term (current) use of aspirin: Secondary | ICD-10-CM | POA: Diagnosis not present

## 2014-02-19 DIAGNOSIS — Z791 Long term (current) use of non-steroidal anti-inflammatories (NSAID): Secondary | ICD-10-CM | POA: Diagnosis not present

## 2014-02-19 DIAGNOSIS — M549 Dorsalgia, unspecified: Secondary | ICD-10-CM | POA: Insufficient documentation

## 2014-02-19 DIAGNOSIS — R0602 Shortness of breath: Secondary | ICD-10-CM | POA: Diagnosis not present

## 2014-02-19 DIAGNOSIS — R6883 Chills (without fever): Secondary | ICD-10-CM | POA: Insufficient documentation

## 2014-02-19 DIAGNOSIS — J3489 Other specified disorders of nose and nasal sinuses: Secondary | ICD-10-CM | POA: Insufficient documentation

## 2014-02-19 LAB — BASIC METABOLIC PANEL
ANION GAP: 14 (ref 5–15)
BUN: 17 mg/dL (ref 6–23)
CALCIUM: 10 mg/dL (ref 8.4–10.5)
CO2: 24 mEq/L (ref 19–32)
CREATININE: 0.79 mg/dL (ref 0.50–1.35)
Chloride: 104 mEq/L (ref 96–112)
GFR calc Af Amer: >90 mL/min (ref >90)
Glucose, Bld: 90 mg/dL (ref 70–99)
Potassium: 4.4 mEq/L (ref 3.7–5.3)
SODIUM: 142 meq/L (ref 137–147)

## 2014-02-19 LAB — CBC WITH DIFFERENTIAL/PLATELET
BASOS ABS: 0 10*3/uL (ref 0.0–0.1)
BASOS PCT: 0 % (ref 0–1)
EOS ABS: 0.1 10*3/uL (ref 0.0–0.7)
EOS PCT: 2 % (ref 0–5)
HEMATOCRIT: 43.3 % (ref 39.0–52.0)
Hemoglobin: 15 g/dL (ref 13.0–17.0)
Lymphocytes Relative: 38 % (ref 12–46)
Lymphs Abs: 2.8 10*3/uL (ref 0.7–4.0)
MCH: 30.8 pg (ref 26.0–34.0)
MCHC: 34.6 g/dL (ref 30.0–36.0)
MCV: 88.9 fL (ref 78.0–100.0)
MONO ABS: 0.6 10*3/uL (ref 0.1–1.0)
Monocytes Relative: 7 % (ref 3–12)
Neutro Abs: 3.9 10*3/uL (ref 1.7–7.7)
Neutrophils Relative %: 53 % (ref 43–77)
PLATELETS: 312 10*3/uL (ref 150–400)
RBC: 4.87 MIL/uL (ref 4.22–5.81)
RDW: 12.5 % (ref 11.5–15.5)
WBC: 7.4 10*3/uL (ref 4.0–10.5)

## 2014-02-19 LAB — TROPONIN I

## 2014-02-19 NOTE — Discharge Instructions (Signed)
Followup with cardiology in [place] as planned. They will continue workup for your symptoms. Return for any newer worse symptoms. Workup here tonight without any significant findings.

## 2014-02-19 NOTE — ED Notes (Signed)
Pt c/o intermittent chest pain and palpitations that get worse when he lays down.  Reports saw his Pcp last week for same.  Was at work today and pressure in chest got worse.  Says doesn't remember everything that happened so thinks may have had a syncopal episode.  EMS reports very peaked t waves.  EMS started IVF and administered  of asa.

## 2014-02-19 NOTE — ED Provider Notes (Signed)
CSN: [id number]     Arrival date & time 02/19/14  1647 History  This chart was scribed for Vanetta Mulders, MD, by Yevette Edwards, ED Scribe. This patient was seen in room APA08/[facility]08 and the patient's care was started at 5:48 PM.   First MD Initiated Contact with Patient 02/19/14 1713     Chief Complaint  Patient presents with  . Chest Pain    Patient is a 28 y.o. male presenting with chest pain. The history is provided by the patient. No language interpreter was used.  Chest Pain Pain location:  Substernal area Pain quality: pressure   Pain radiates to:  Precordial region Pain radiates to the back: no   Pain severity:  Moderate (7/10) Onset quality:  Gradual Timing:  Intermittent Progression:  Unchanged Chronicity:  Recurrent Relieved by:  Nothing Worsened by:  Certain positions Ineffective treatments:  None tried Associated symptoms: back pain, diaphoresis, headache, nausea and shortness of breath   Associated symptoms: no abdominal pain, no cough, no fever and not vomiting   Risk factors: male sex and smoking   Risk factors: not obese    HPI Comments: Jake Luna is a 28 y.o. male who presents to the Emergency Department via EMS complaining of intermittent substernal chest pain which increased in severity approximately three hours ago and which radiates to the left side of his chest, but not to his back.  He characterizes the chest pain as pressure, and he rates the current pain as 7/10. Jake Luna reports he has had chronic, baseline chest tightness and SOB for multiple months, but the symptoms worsened today. He reports the chest tightness is increased at night. Jake Luna endorses nausea, diaphoresis, and lightheadedness associated with the chest pain.  He was treated by his PCP last week for similar symptoms. He has an appointment in two days at [facility]; he will have an echo performed.  He denies emesis.  PTA, the pt remembers walking to the office at his work  because he was experiencing increasing chest tightness and SOB. He believes he then had a syncopal episode as he does not recall subsequent details. He denies a current headache or current pain to his back, neck, or occiput. He reports a prior episode of syncope not related to his current symptoms.  In addition to his chief complaint, the pt endorses chills and cold symptoms yesterday evening.  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 He is a current smoker.   History reviewed. No pertinent past medical history.  Past Surgical History  Procedure Laterality Date  . Shoulder surgery     Family History  Problem Relation Age of Onset  . Diabetes Mother   . Heart failure Father    History  Substance Use Topics  . Smoking status: Current Every Day Smoker  . Smokeless tobacco: Not on file  . Alcohol Use: No    Review of Systems  Constitutional: Positive for chills and diaphoresis. Negative for fever.  HENT: Positive for congestion and sinus pressure. Negative for sore throat.   Eyes: Negative for visual disturbance.  Respiratory: Positive for shortness of breath. Negative for cough.   Cardiovascular: Positive for chest pain. Negative for leg swelling.  Gastrointestinal: Positive for nausea. Negative for vomiting, abdominal pain and diarrhea.  Genitourinary: Negative for dysuria and hematuria.  Musculoskeletal: Positive for back pain.  Skin: Positive for rash. Wound: To right lower leg.  Neurological: Positive for syncope and headaches.  Hematological: Does not  bruise/bleed easily.  Psychiatric/Behavioral: Negative for confusion.    Allergies  Sulfa antibiotics  Home Medications   Prior to Admission medications   Medication Sig Start Date End Date Taking? Authorizing Provider  aspirin 81 MG chewable tablet Chew 324 mg by mouth once as needed.    Historical Provider, MD  ibuprofen (ADVIL,MOTRIN) 200 MG tablet Take 200 mg by mouth every 6 (six) hours  as needed. For pain    Historical Provider, MD   Triage Vitals: BP 129/70  Pulse 76  Temp(Src) 97.8 F (36.6 C) (Oral)  Resp 20  Ht  (1.854 m)  Wt 170 lb (77.111 kg)  BMI 22.43 kg/m2  SpO2 100%  Physical Exam  Nursing note and vitals reviewed. Constitutional: He is oriented to person, place, and time. He appears well-developed and well-nourished. No distress.  HENT:  Head: Normocephalic and atraumatic.  Moist mucous membranes.   Eyes: Conjunctivae and EOM are normal.  Neck: Neck supple. No tracheal deviation present.  Cardiovascular: Normal rate, regular rhythm and normal heart sounds.   No murmur heard. Pulmonary/Chest: Effort normal and breath sounds normal. No respiratory distress. He has no wheezes. He has no rales.  Abdominal: Bowel sounds are normal. There is no tenderness.  Musculoskeletal: Normal range of motion. He exhibits no edema.  Neurological: He is alert and oriented to person, place, and time. He has normal reflexes. No cranial nerve deficit.  Skin: Skin is warm and dry.  Psychiatric: He has a normal mood and affect. His behavior is normal.    ED Course  Procedures (including critical care time)  DIAGNOSTIC STUDIES: Oxygen Saturation is 100% on room air, normal by my interpretation.    COORDINATION OF CARE:  6:01 PM- Discussed treatment plan with patient, and the patient agreed to the plan. The plan includes lab work, a chest x-ray, and an EKG.   Results for orders placed during the hospital encounter of 02/19/14  CBC WITH DIFFERENTIAL      Result Value Ref Range   WBC 7.4  4.0 - 10.5 K/uL   RBC 4.87  4.22 - 5.81 MIL/uL   Hemoglobin 15.0  13.0 - 17.0 g/dL   HCT 16.1  09.6 - 04.5 %   MCV 88.9  78.0 - 100.0 fL   MCH 30.8  26.0 - 34.0 pg   MCHC 34.6  30.0 - 36.0 g/dL   RDW 40.9  81.1 - 91.4 %   Platelets 312  150 - 400 K/uL   Neutrophils Relative % 53  43 - 77 %   Neutro Abs 3.9  1.7 - 7.7 K/uL   Lymphocytes Relative 38  12 - 46 %   Lymphs Abs  2.8  0.7 - 4.0 K/uL   Monocytes Relative 7  3 - 12 %   Monocytes Absolute 0.6  0.1 - 1.0 K/uL   Eosinophils Relative 2  0 - 5 %   Eosinophils Absolute 0.1  0.0 - 0.7 K/uL   Basophils Relative 0  0 - 1 %   Basophils Absolute 0.0  0.0 - 0.1 K/uL  BASIC METABOLIC PANEL      Result Value Ref Range   Sodium 142  137 - 147 mEq/L   Potassium 4.4  3.7 - 5.3 mEq/L   Chloride 104  96 - 112 mEq/L   CO2 24  19 - 32 mEq/L   Glucose, Bld 90  70 - 99 mg/dL   BUN 17  6 - 23 mg/dL   Creatinine, Ser  0.79  0.50 - 1.35 mg/dL   Calcium 16.1  8.4 - 09.6 mg/dL   GFR calc non Af Amer >90  >90 mL/min   GFR calc Af Amer >90  >90 mL/min   Anion gap 14  5 - 15  TROPONIN I      Result Value Ref Range   Troponin I <0.30  <0.30 ng/mL   Dg Chest Portable 1 View  02/19/2014   CLINICAL DATA:  Chest pain  EXAM: PORTABLE CHEST - 1 VIEW  COMPARISON:  None.  FINDINGS: The heart size and mediastinal contours are within normal limits. Both lungs are clear. The visualized skeletal structures are unremarkable.  IMPRESSION: No active disease.   Electronically Signed   By: Maryclare Bean M.D.   On: 02/19/2014 17:03      MDM   Final diagnoses:  Chest pain, unspecified chest pain type  Syncope, unspecified syncope type    Outpatient with intermittent chest pain and some shortness of breath now for several weeks. Patient had onset of some substernal chest pain radiating to the left side some of which happened before around 3:00 in the afternoon while at work. Became severe. The patient unwitnessed had a syncopal episode. Patient upon arrival of EMS seemed to be fine. Was not having any breathing problems. EMS gave him 324 mg of aspirin. Patient states that the chest discomfort has improved significantly. Patient's had similar symptoms to this in the past but has not had the syncope associated with it. Patient is already been referred to the cardiology is being followed by them. They're planning an echocardiogram for later this  week.  Patient's troponin here is negative EKG without any acute changes. Patient's electrolytes without sniffing abnormalities. Patient's cardiac monitoring without any arrhythmias. Patient is not having a hypoxia does not have any tachycardia. The symptoms have been ongoing for the past several weeks. On and off.  I personally performed the services described in this documentation, which was scribed in my presence. The recorded information has been reviewed and is accurate.     Vanetta Mulders, MD 02/19/14 [phone]

## 2016-04-13 IMAGING — CR DG CHEST 1V PORT
1 series · 1 of 1 positions shown · non-contrast
Comparison: None.

CLINICAL DATA: Chest pain

EXAM:
PORTABLE CHEST - 1 VIEW

[portable]
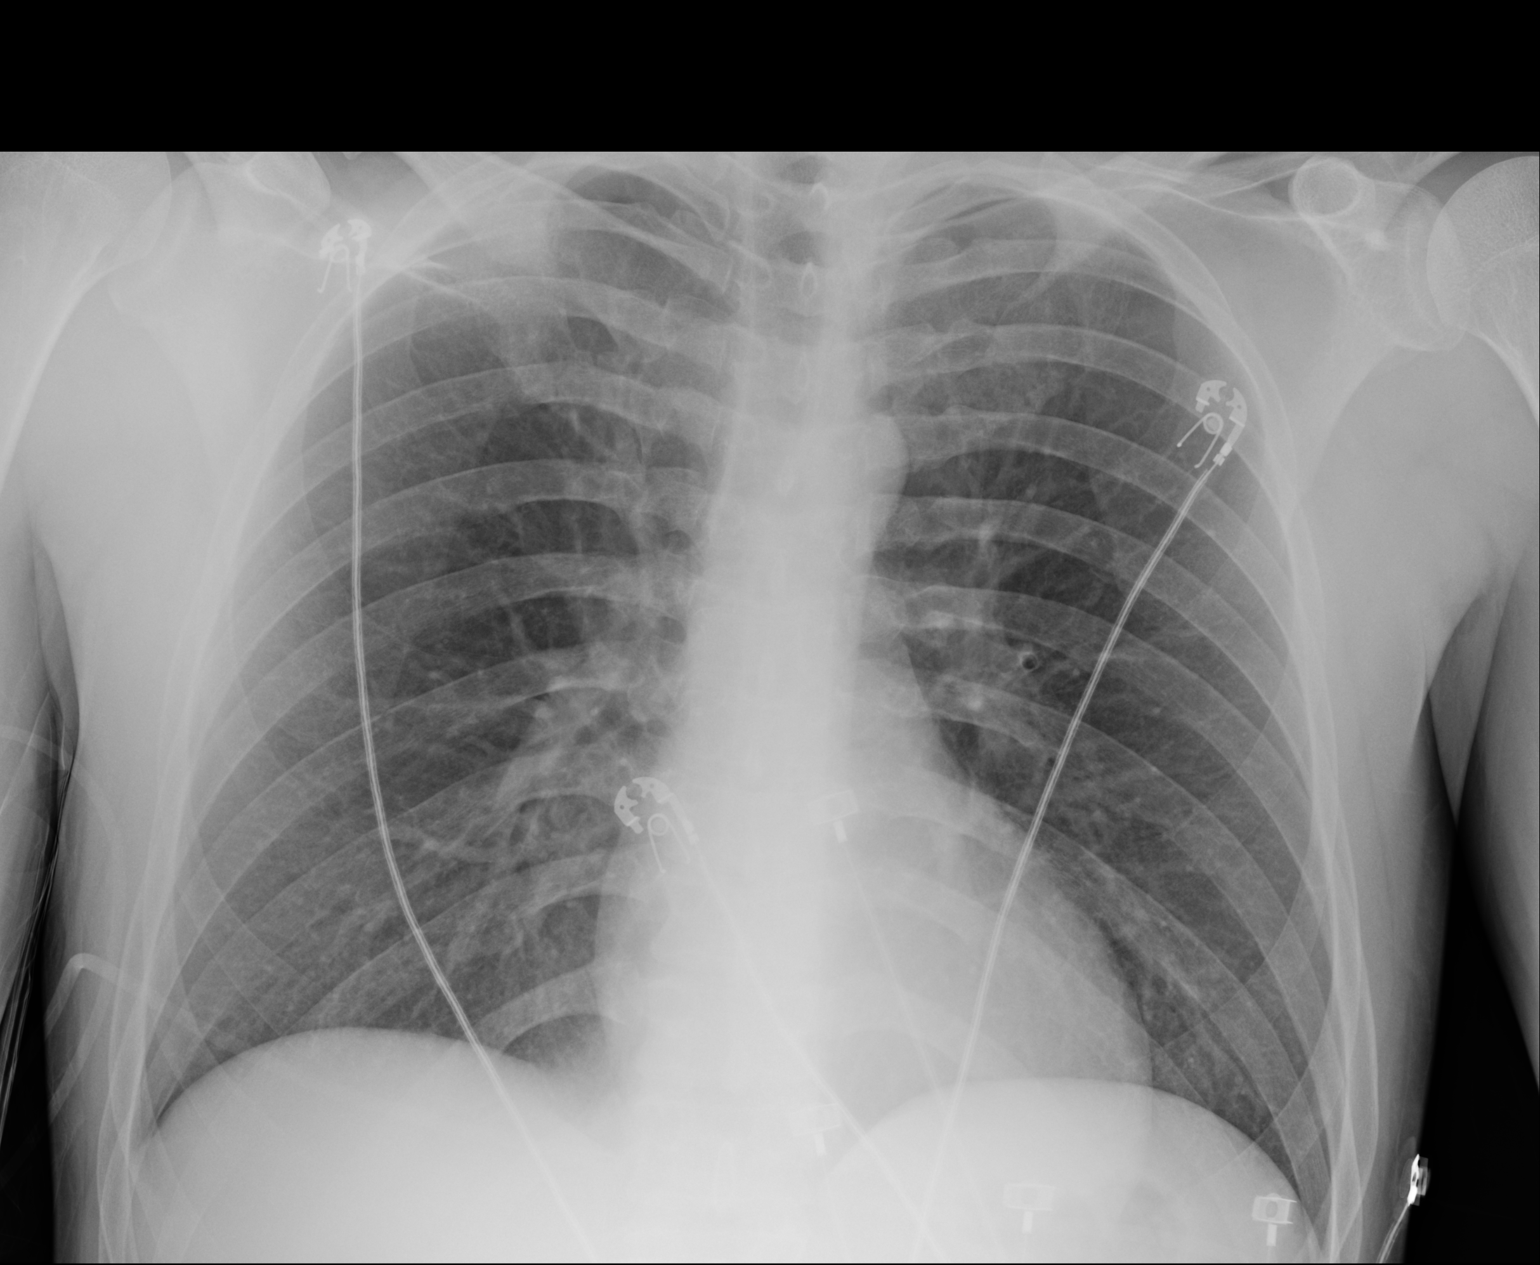

[1 of 1 positions shown; findings below may reference images not displayed]

FINDINGS: The heart size and mediastinal contours are within normal limits.
Both lungs are clear. The visualized skeletal structures are
unremarkable.
IMPRESSION: No active disease.
